# Patient Record
Sex: Female | Born: 1995 | Race: White | Hispanic: No | Marital: Single | Smoking: Never smoker
Health system: Southern US, Community
[De-identification: ages and names within clinical notes are randomized; demographics above are authoritative.]

## PROBLEM LIST (undated history)

## (undated) DIAGNOSIS — B2790 Infectious mononucleosis, unspecified without complication: Secondary | ICD-10-CM

## (undated) DIAGNOSIS — Z8709 Personal history of other diseases of the respiratory system: Secondary | ICD-10-CM

---

## 2015-08-19 ENCOUNTER — Emergency Department
Admission: EM | Admit: 2015-08-19 | Discharge: 2015-08-19 | Disposition: A | Discharge status: Home / Self Care | Payer: BLUE CROSS/BLUE SHIELD | Source: Home / Self Care | Attending: Family Medicine | Admitting: Family Medicine

## 2015-08-19 ENCOUNTER — Emergency Department (INDEPENDENT_AMBULATORY_CARE_PROVIDER_SITE_OTHER): Payer: BLUE CROSS/BLUE SHIELD

## 2015-08-19 ENCOUNTER — Encounter: Payer: Self-pay | Admitting: Emergency Medicine

## 2015-08-19 DIAGNOSIS — J189 Pneumonia, unspecified organism: Secondary | ICD-10-CM

## 2015-08-19 DIAGNOSIS — J181 Lobar pneumonia, unspecified organism: Principal | ICD-10-CM

## 2015-08-19 HISTORY — DX: Personal history of other diseases of the respiratory system: Z87.09

## 2015-08-19 HISTORY — DX: Infectious mononucleosis, unspecified without complication: B27.90

## 2015-08-19 LAB — POCT INFLUENZA A/B
Influenza A, POC: NEGATIVE
Influenza B, POC: NEGATIVE

## 2015-08-19 LAB — POCT CBC W AUTO DIFF (K'VILLE URGENT CARE)

## 2015-08-19 LAB — POCT RAPID STREP A (OFFICE): RAPID STREP A SCREEN: NEGATIVE

## 2015-08-19 MED ORDER — BENZONATATE 200 MG PO CAPS: 200.0000 mg | ORAL_CAPSULE | Freq: Every day | ORAL | Status: DC

## 2015-08-19 MED ORDER — AZITHROMYCIN 250 MG PO TABS: ORAL_TABLET | ORAL | Status: DC

## 2015-08-19 NOTE — ED Notes (Signed)
Reports fever, cough, congestion for several days; last night nausea and vomiting. Has just moved out of apt. that has black mould; room mates and she have been ill since 04/2015. Seen by PCP 08/14/15 and treated with zyrtec, ibuprofen and zofran. Was not tested for Flu or Strep. No recent OTC. No Flu vaccination this season.

## 2015-08-19 NOTE — Discharge Instructions (Signed)
Take plain guaifenesin (1200mg  extended release tabs such as Mucinex) twice daily, with plenty of water, for cough and congestion.  May add Pseudoephedrine (30mg , one or two every 4 to 6 hours) for sinus congestion.  Get adequate rest.   Try warm salt water gargles for sore throat.  Stop all antihistamines for now, and other non-prescription cough/cold preparations. May take Ibuprofen 200mg , 3 to 4 tabs every 8 hours with food for chest/sternum discomfort, fever, etc. If symptoms become significantly worse during the night or over the weekend, proceed to the local emergency room.

## 2015-08-19 NOTE — ED Provider Notes (Signed)
CSN: [id number]     Arrival date & time 08/19/15  1111 History   First MD Initiated Contact with Patient 08/19/15 1245     Chief Complaint  Patient presents with  . Cough  . Sore Throat  . Fever  . Generalized Body Aches     HPI Comments: Patient has had intermittent cough and congestion since August when she was living in an apartment that had mold present.  She had felt well, however, and recently moved out of the apartment.   During the past 6 days she has developed typical cold-like symptoms including mild sore throat, sinus congestion, headache, fatigue, and cough.  She has developed wheezing and shortness of breath with activity, and has had fever as high as 104.   The history is provided by the patient.    Past Medical History  Diagnosis Date  . History of strep sore throat   . Mononucleosis    History reviewed. No pertinent past surgical history. Family History  Problem Relation Age of Onset  . Hypertension Father   . Cancer Father    Social History  Substance Use Topics  . Smoking status: Never Smoker   . Smokeless tobacco: None  . Alcohol Use: No   OB History    No data available     Review of Systems + sore throat + cough + sneezing No pleuritic pain + wheezing + nasal congestion + post-nasal drainage No sinus pain/pressure No itchy/red eyes No earache No hemoptysis + SOB + fever, + chills + nausea + vomiting, resolved No abdominal pain No diarrhea No urinary symptoms No skin rash + fatigue No myalgias + headache Used OTC meds without relief  Allergies  Apple; Peach; and Pear  Home Medications   Prior to Admission medications   Medication Sig Start Date End Date Taking? Authorizing Provider  levonorgestrel-ethinyl estradiol (AVIANE,ALESSE,LESSINA) 0.1-20 MG-MCG tablet Take 1 tablet by mouth daily.   Yes Historical Provider, MD  azithromycin (ZITHROMAX Z-PAK) 250 MG tablet Take 2 tabs today; then begin one tab once daily for 4 more days.  08/19/15   Lattie Haw, MD  benzonatate (TESSALON) 200 MG capsule Take 1 capsule (200 mg total) by mouth at bedtime. Take as needed for cough 08/19/15   Lattie Haw, MD   Meds Ordered and Administered this Visit  Medications - No data to display  BP 111/71 mmHg  Pulse 127  Temp(Src) 101 F (38.3 C) (Oral)  Resp 16  Ht  (1.676 m)  Wt 150 lb (68.04 kg)  BMI 24.22 kg/m2  SpO2 96%  LMP 07/29/2015 (Approximate) No data found.   Physical Exam Nursing notes and Vital Signs reviewed. Appearance:  Patient appears stated age, and in no acute distress Eyes:  Pupils are equal, round, and reactive to light and accomodation.  Extraocular movement is intact.  Conjunctivae are not inflamed  Ears:  Canals normal.  Tympanic membranes normal.  Nose:  Mildly congested turbinates.  No sinus tenderness.   Pharynx:  Normal Neck:  Supple.   Mildly tender enlarged posterior nodes are palpated bilaterally  Lungs:  Clear to auscultation.  Breath sounds are equal.  Moving air well. Chest:  Distinct tenderness to palpation over the mid-sternum.  Heart:  Regular rate and rhythm without murmurs, rubs, or gallops.  Abdomen:  Nontender without masses or hepatosplenomegaly.  Bowel sounds are present.  No CVA or flank tenderness.  Extremities:  No edema.  No calf tenderness Skin:  No rash present.  ED Course  Procedures  None    Labs Reviewed  POCT RAPID STREP A (OFFICE) negative  POCT INFLUENZA A/B negative  POCT CBC W AUTO DIFF ([facility]):  WBC 10.6; LY 12.8; MO 5.5; GR 81.7; Hgb 12.7; Platelets 225     Imaging Review Dg Chest 2 View  08/19/2015  CLINICAL DATA:  Trouble breathing, intermittent fever EXAM: CHEST  2 VIEW COMPARISON:  None. FINDINGS: Cardiomediastinal silhouette is unremarkable. There is streaky infiltrate/ pneumonia in right middle lobe. Follow-up to resolution is recommended after appropriate treatment. Left lung is clear. No pulmonary edema. IMPRESSION: Streaky  infiltrate/ pneumonia in right middle lobe. Follow-up to resolution is recommended. Left lung is clear. Electronically Signed   By: Natasha MeadLiviu  Pop M.D.   On: 08/19/2015 13:19     MDM   1. Right middle lobe pneumonia    Begin Z-pak for atypical coverage.  Prescription written for Benzonatate [facility](Tessalon) to take at bedtime for night-time cough.  Take plain guaifenesin (1200mg  extended release tabs such as Mucinex) twice daily, with plenty of water, for cough and congestion.  May add Pseudoephedrine (30mg , one or two every 4 to 6 hours) for sinus congestion.  Get adequate rest.   Try warm salt water gargles for sore throat.  Stop all antihistamines for now, and other non-prescription cough/cold preparations. May take Ibuprofen 200mg , 3 to 4 tabs every 8 hours with food for chest/sternum discomfort, fever, etc. If symptoms become significantly worse during the night or over the weekend, proceed to the local emergency room.  Followup with Family Doctor in about 10 days for repeat chest x-ray.    Lattie HawStephen A Beese, MD 08/25/15 [phone]

## 2015-09-19 ENCOUNTER — Encounter: Payer: Self-pay | Admitting: Osteopathic Medicine

## 2015-09-19 ENCOUNTER — Ambulatory Visit (INDEPENDENT_AMBULATORY_CARE_PROVIDER_SITE_OTHER): Payer: BLUE CROSS/BLUE SHIELD | Admitting: Osteopathic Medicine

## 2015-09-19 ENCOUNTER — Ambulatory Visit: Payer: BLUE CROSS/BLUE SHIELD | Admitting: Osteopathic Medicine

## 2015-09-19 ENCOUNTER — Ambulatory Visit (INDEPENDENT_AMBULATORY_CARE_PROVIDER_SITE_OTHER): Payer: BLUE CROSS/BLUE SHIELD

## 2015-09-19 VITALS — BP 127/78 | HR 71 | Ht 66.0 in | Wt 152.0 lb

## 2015-09-19 DIAGNOSIS — Z8701 Personal history of pneumonia (recurrent): Secondary | ICD-10-CM | POA: Insufficient documentation

## 2015-09-19 DIAGNOSIS — R9389 Abnormal findings on diagnostic imaging of other specified body structures: Secondary | ICD-10-CM

## 2015-09-19 DIAGNOSIS — R938 Abnormal findings on diagnostic imaging of other specified body structures: Secondary | ICD-10-CM

## 2015-09-19 DIAGNOSIS — Z0000 Encounter for general adult medical examination without abnormal findings: Secondary | ICD-10-CM | POA: Insufficient documentation

## 2015-09-19 DIAGNOSIS — J189 Pneumonia, unspecified organism: Secondary | ICD-10-CM

## 2015-09-19 DIAGNOSIS — R05 Cough: Secondary | ICD-10-CM

## 2015-09-19 NOTE — Patient Instructions (Signed)
Thanks for coming in today! You will be due for your next annual physical in one year, please come back sooner if there are any problems or concerns that come up!

## 2015-09-19 NOTE — Progress Notes (Signed)
HPI: Bridget PatesBrittany Campbell is a 20 y.o. female who [facility] [place] for chief complaint of:  Chief Complaint  Patient presents with  . Annual Exam  . Establish Care    followup up from pneumonia 08/2015     RECENT PNEUMONIA DIAGNOSIS - CXR(+) 08/2015 in urgent care here, feeling better but still some persistent cough. No more fever/chills. Repeat CXR was recommended, will get this today, no productive cough  Preventive care reviewed as below.    Past medical, social and family history reviewed: Past Medical History  Diagnosis Date  . History of strep sore throat   . Mononucleosis    No past surgical history on file. Social History  Substance Use Topics  . Smoking status: Never Smoker   . Smokeless tobacco: Not on file  . Alcohol Use: No   Family History  Problem Relation Age of Onset  . Hypertension Father   . Cancer Father     Current Outpatient Prescriptions  Medication Sig Dispense Refill  . levonorgestrel-ethinyl estradiol (AVIANE,ALESSE,LESSINA) 0.1-20 MG-MCG tablet Take 1 tablet by mouth daily.     No current facility-administered medications for this visit.   Allergies  Allergen Reactions  . Apple   . Peach [Prunus Persica]   . Pear       Review of Systems: CONSTITUTIONAL:  No  fever, no chills, No  unintentional weight changes HEAD/EYES/EARS/NOSE/THROAT: No  headache, no vision change, no hearing change, No  sore throat, No  sinus pressure CARDIAC: No  chest pain, No  pressure, No palpitations, No  orthopnea RESPIRATORY: (+) mild cough, No  shortness of breath/wheeze GASTROINTESTINAL: No  nausea, No  vomiting, No  abdominal pain, No  blood in stool, No  diarrhea, No  constipation  MUSCULOSKELETAL: No  myalgia/arthralgia GENITOURINARY: No  incontinence, No  abnormal genital bleeding/discharge SKIN: No  rash/wounds/concerning lesions HEM/ONC: No  easy bruising/bleeding, No  abnormal lymph node ENDOCRINE: No  polyuria/polydipsia/polyphagia, No  heat/cold intolerance  NEUROLOGIC: No  weakness, No  dizziness, No  slurred speech PSYCHIATRIC: No  concerns with depression, No  concerns with anxiety, No sleep problems  Exam:  BP 127/78 mmHg  Pulse 71  Ht 5\' 6"  (1.676 m)  Wt 152 lb (68.947 kg)  BMI 24.55 kg/m2 Constitutional: VS see above. General Appearance: alert, well-developed, well-nourished, NAD Eyes: Normal lids and conjunctive, non-icteric sclera, PERRLA Ears, Nose, Mouth, Throat: MMM, Normal external inspection ears/nares/mouth/lips/gums, TM normal, pharynx no erythema No  exudate Neck: No masses, trachea midline. No thyroid enlargement/tenderness/mass appreciated. No lymphadenopathy Respiratory: Normal respiratory effort. no wheeze, no rhonchi, no rales Cardiovascular: S1/S2 normal, no murmur, no rub/gallop auscultated. RRR.  Gastrointestinal: Nontender, no masses. No hepatomegaly, no splenomegaly. No hernia appreciated. Bowel sounds normal. Rectal exam deferred.  Musculoskeletal: Gait normal. No clubbing/cyanosis of digits.  Neurological: No cranial nerve deficit on limited exam. Motor and sensation intact and symmetric Skin: warm, dry, intact. No rash/ulcer. No concerning nevi or subq nodules on limited exam.   Psychiatric: Normal judgment/insight. Normal mood and affect. Oriented x3.   CXR on personal review Cardiomediastinal silhouette/heart size: normal Obvious bony abnormality: none Infiltrate: none Mass or other opacity: none Atelectasis: none Diaphragms: normal Lateral view: normal  RML infiltrate appears to have resolved Images were reviewed with the patient. Pt counseled that radiologist will review the images as well, our office will call if the formal read reveals any significant findings other than what has been noted above.    ASSESSMENT/PLAN:  Annual physical exam - Pt declines flu vax, HIV screen. Advised can screen lipids/DM but she is low risk. HPV vax declined.  Advised Pap age 69. GC/CT neg 2015.  History of pneumonia - Plan: DG Chest 2 View  Abnormal chest x-ray - Plan: DG Chest 2 View   FEMALE PREVENTIVE CARE  ANNUAL SCREENING/COUNSELING Tobacco - Never  Alcohol - none Diet/Exercise - HEALTHY HABITS DISCUSSED TO DECREASE CV RISK Sexual Health - Yes with female. STI - The patient denies history of sexually transmitted disease. INTERESTED IN STI TESTING - no Depression - PQH2 Negative Domestic violence concerns - no HTN SCREENING - SEE VITALS Vaccination status - SEE BELOW  INFECTIOUS DISEASE SCREENING HIV - all adults 15-65 - had been tested 2015 and negative (can't find this in records)  GC/CT - sexually active - had been tested 2015 and negative  HepC - born 58-1966 - does not need TB - if risk/required by employer - does not need  DISEASE SCREENING Lipid - (Low risk screen M35/F45; High risk screen M25/F35 if HTN, Tob, FH CHD M<55/F<65) - does not need DM2 (45+ or Risk = FH 1st deg DM, Hx GDM, overweight/sedentary, high-risk ethnicity, HTN) - does not need  CANCER SCREENING Cervical - Pap q3 yr age 59+, Pap + HPV q5y age 71+ - PAP - does not need, needs age 35 Breast - Mammo age 37+ (C) and biennial age 28-75 (A) - MAMMO - does not need  ADULT VACCINATION Influenza - annual - was offered and declined by the patient Td booster every 10 years - was not indicated - had this prior to entering college HPV - age <26yo - had maybe first 2 vaccines, not sure if she completed the series.   Return in about 1 year (around 09/18/2016), or sooner if needed, for ANNUAL PHYSICAL EXAM, PAP AT AGE 69.

## 2017-04-20 IMAGING — CR DG CHEST 2V
2 series · 2 of 2 positions shown · non-contrast
Comparison: None.

CLINICAL DATA: Trouble breathing, intermittent fever

EXAM:
CHEST  2 VIEW

[chest pa]
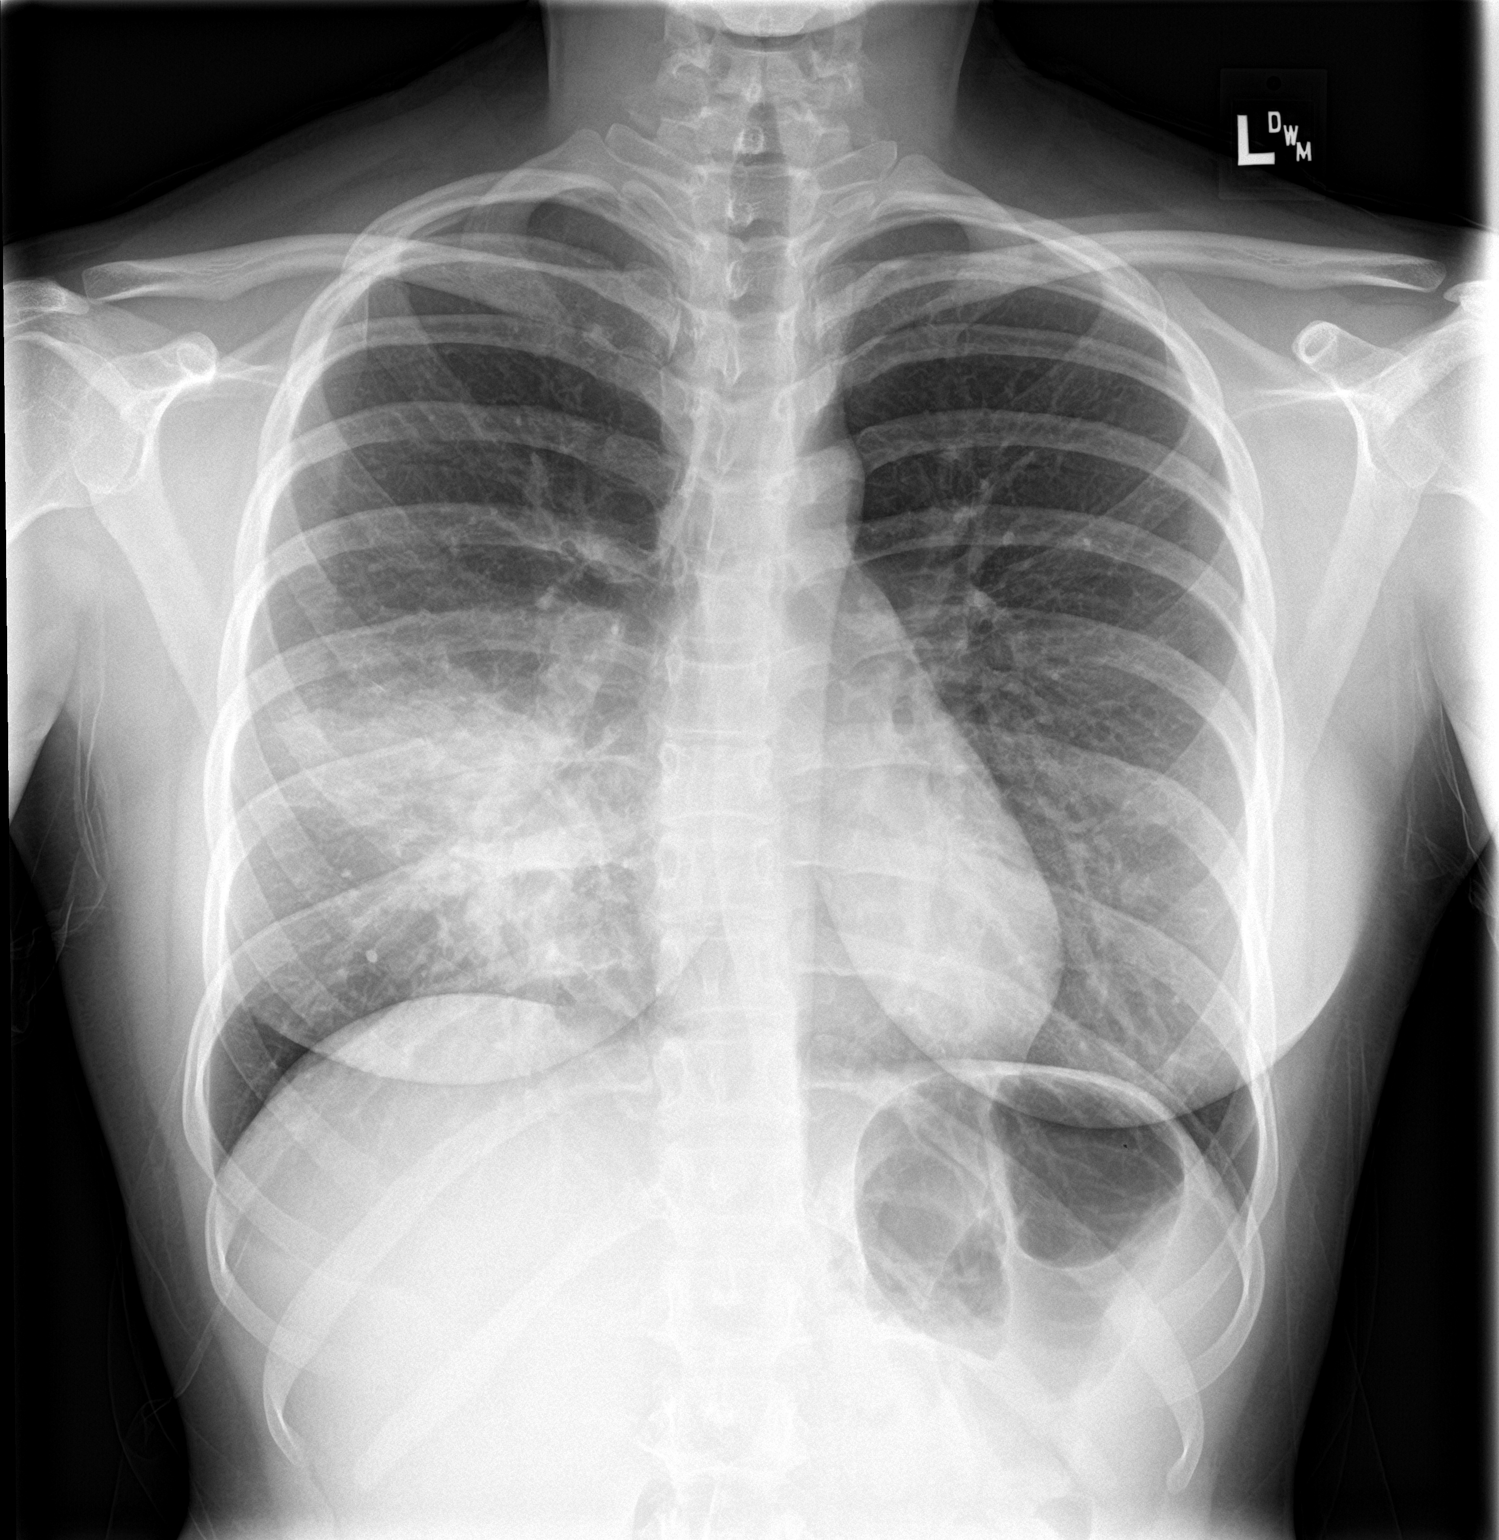

[chest lat]
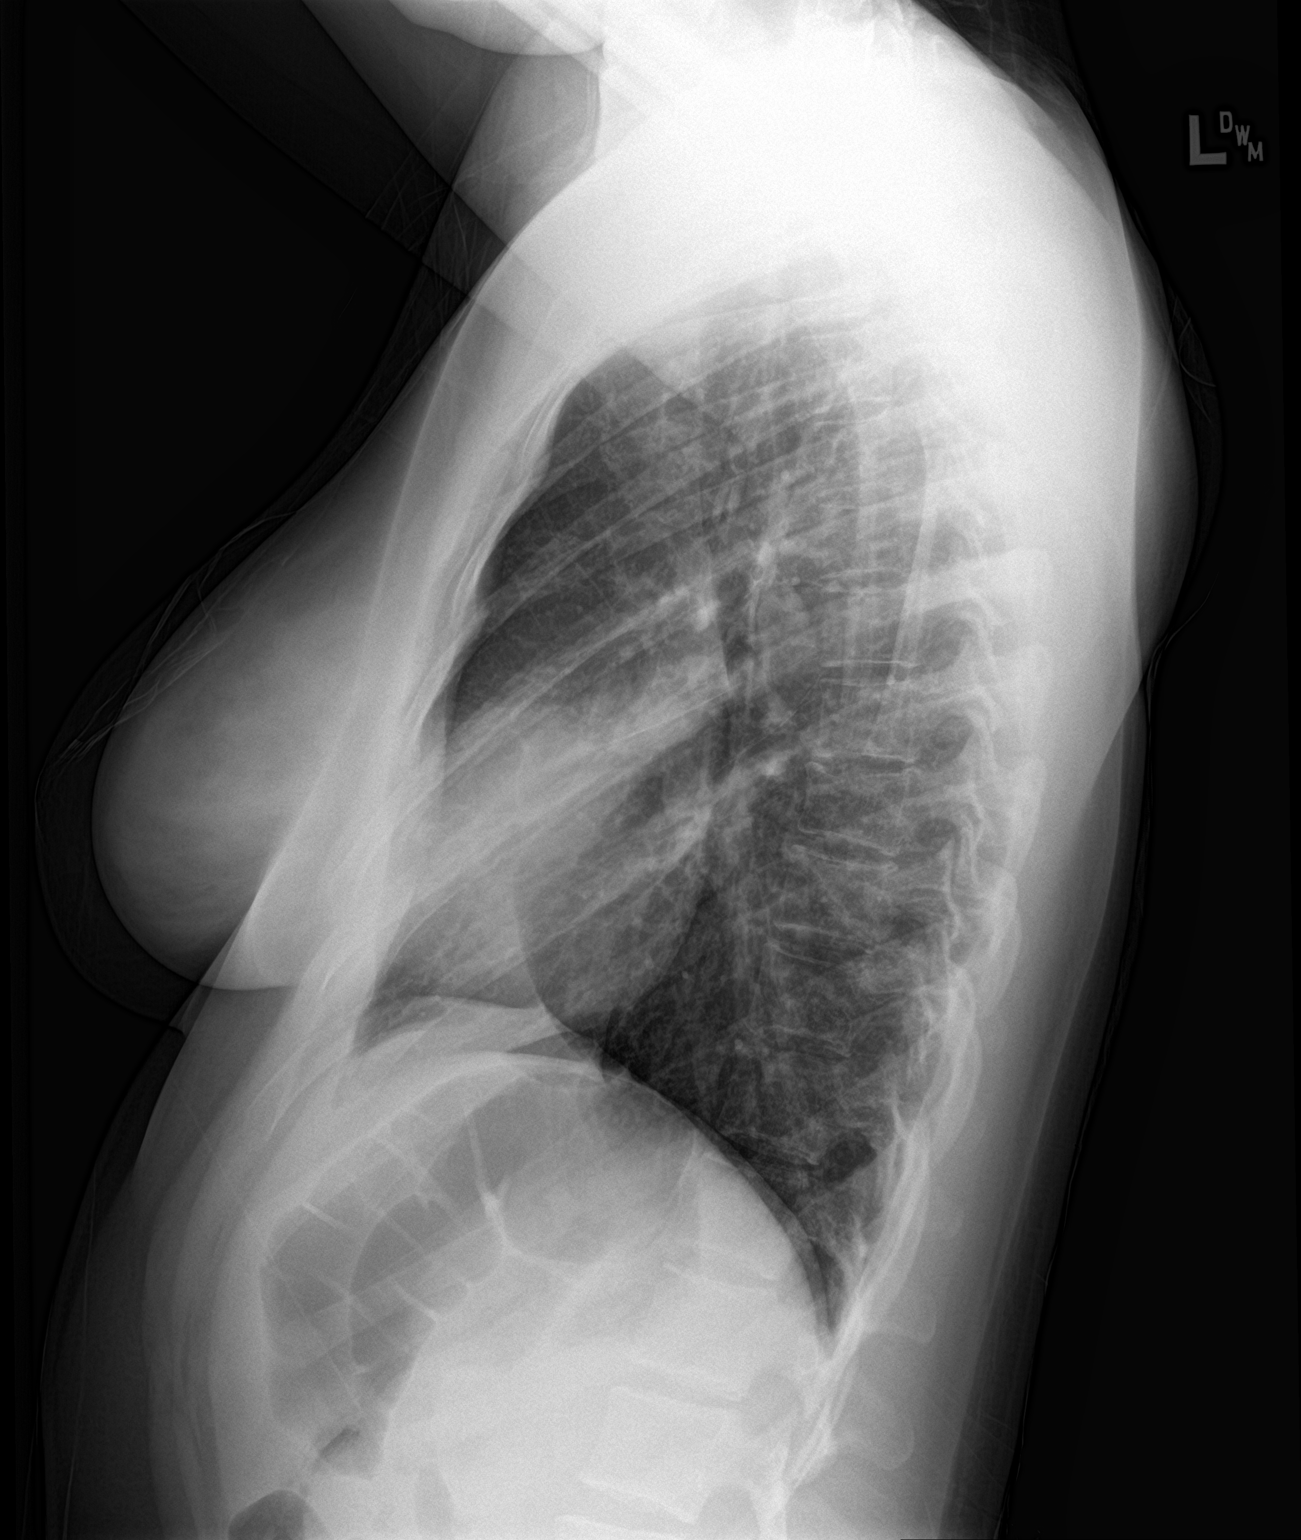

[2 of 2 positions shown; findings below may reference images not displayed]

FINDINGS: Cardiomediastinal silhouette is unremarkable. There is streaky
infiltrate/ pneumonia in right middle lobe. Follow-up to resolution
is recommended after appropriate treatment. Left lung is clear. No
pulmonary edema.
IMPRESSION: Streaky infiltrate/ pneumonia in right middle lobe. Follow-up to
resolution is recommended. Left lung is clear.

## 2017-05-21 IMAGING — CR DG CHEST 2V
2 series · 2 of 2 positions shown · non-contrast
Comparison: PA and lateral chest x-ray August 19, 2015

CLINICAL DATA: Recent diagnosis of pneumonia, persistent cough.

EXAM:
CHEST  2 VIEW

[chest pa]
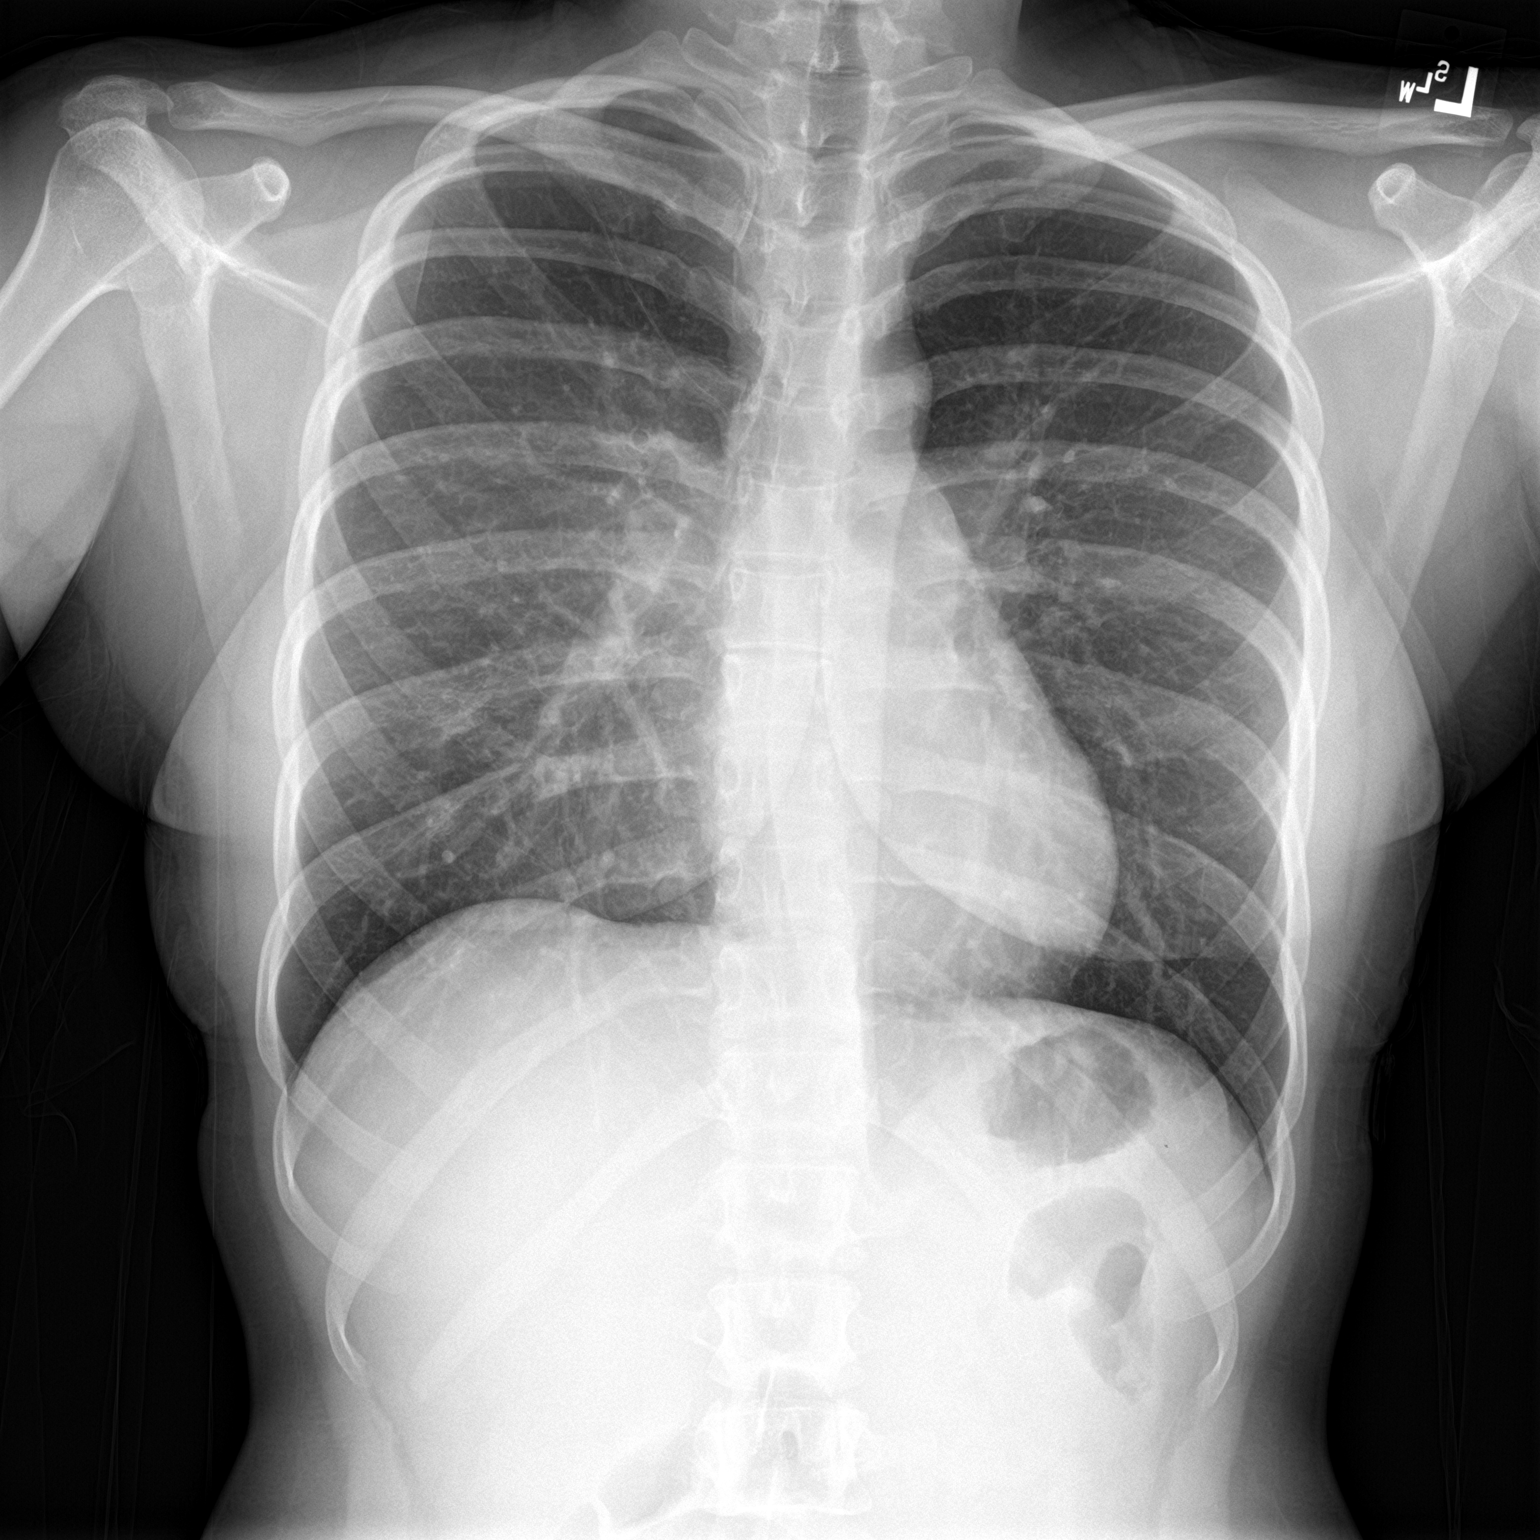

[chest lat]
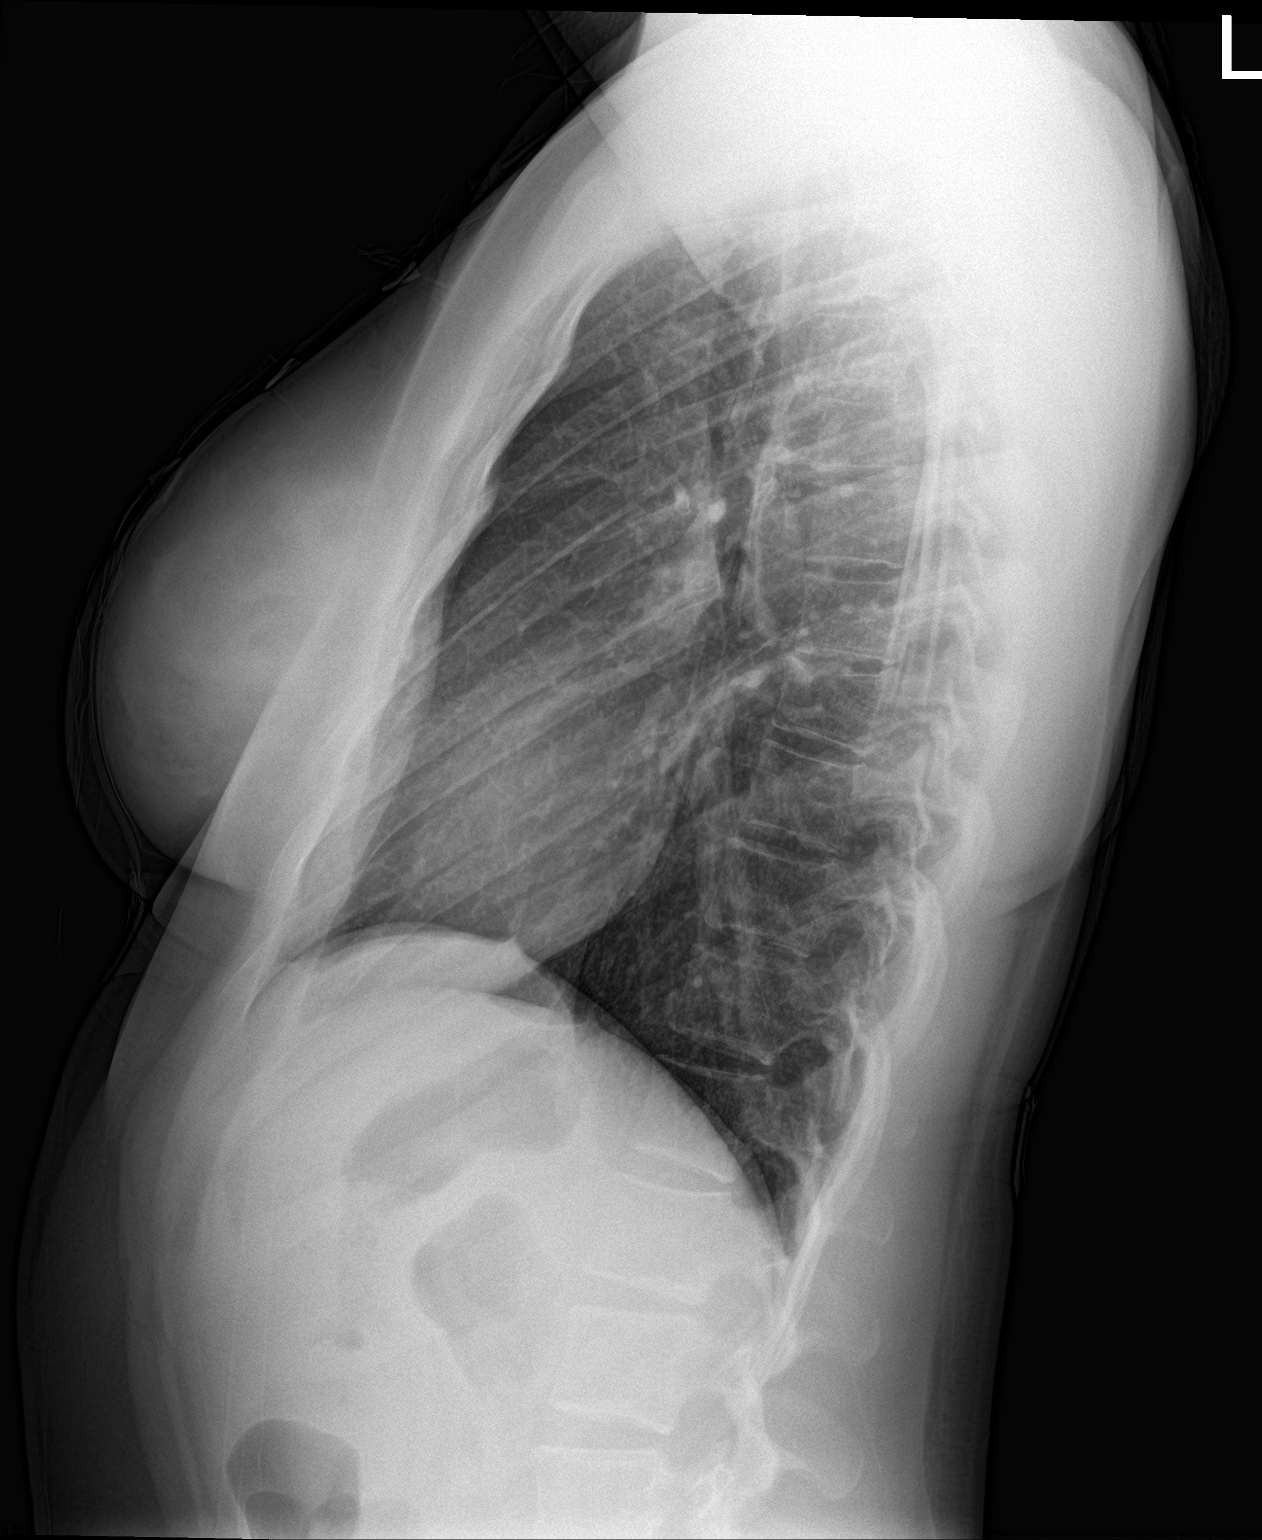

[2 of 2 positions shown; findings below may reference images not displayed]

FINDINGS: Infiltrate in the right middle lobe has largely cleared. No definite
residual infiltrate is observed. Increased density is demonstrated
in the perihilar regions bilateral deeply due to the overlying
breast parenchyma. There is no pleural effusion. No air bronchograms
are observed. The heart and pulmonary vascularity are normal. The
bony thorax is unremarkable.
IMPRESSION: Interval resolution of right middle lobe pneumonia. No definite
acute cardiopulmonary abnormality.

If the patient's cough persists chest CT scanning may be the most
useful next imaging step.
